# Patient Record
Sex: Male | Born: 1985 | Marital: Single
Health system: Southern US, Community
[De-identification: ages and names within clinical notes are randomized; demographics above are authoritative.]

---

## 2014-01-19 ENCOUNTER — Ambulatory Visit (HOSPITAL_COMMUNITY)
Admission: RE | Admit: 2014-01-19 | Discharge: 2014-01-19 | Disposition: A | Discharge status: Home / Self Care | Payer: Self-pay | Source: Ambulatory Visit | Attending: Internal Medicine | Admitting: Internal Medicine

## 2014-01-19 ENCOUNTER — Other Ambulatory Visit (HOSPITAL_COMMUNITY): Payer: Self-pay | Admitting: Internal Medicine

## 2014-01-19 DIAGNOSIS — N509 Disorder of male genital organs, unspecified: Secondary | ICD-10-CM | POA: Insufficient documentation

## 2014-01-19 DIAGNOSIS — N508 Other specified disorders of male genital organs: Secondary | ICD-10-CM | POA: Insufficient documentation

## 2014-09-25 IMAGING — US US SCROTUM
1 series · 14 of 25 positions shown · non-contrast
Comparison: None.

CLINICAL DATA: Left-sided testicular pain.

EXAM:
SCROTAL ULTRASOUND
DOPPLER ULTRASOUND OF THE TESTICLES
TECHNIQUE: Complete ultrasound examination of the testicles, epididymis, and
other scrotal structures was performed. Color and spectral Doppler
ultrasound were also utilized to evaluate blood flow to the
testicles.

[Series 1: us scrotum · 0.06mm/px · 14 of 60 slices shown]
[im 1/60]
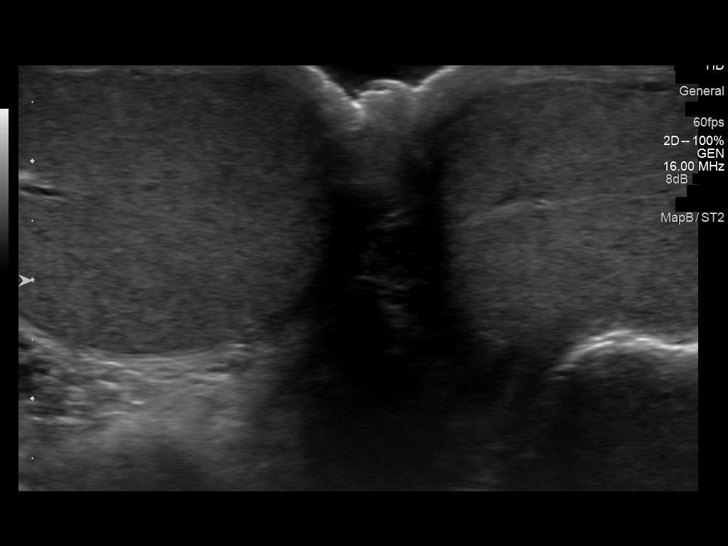
[im 5/60]
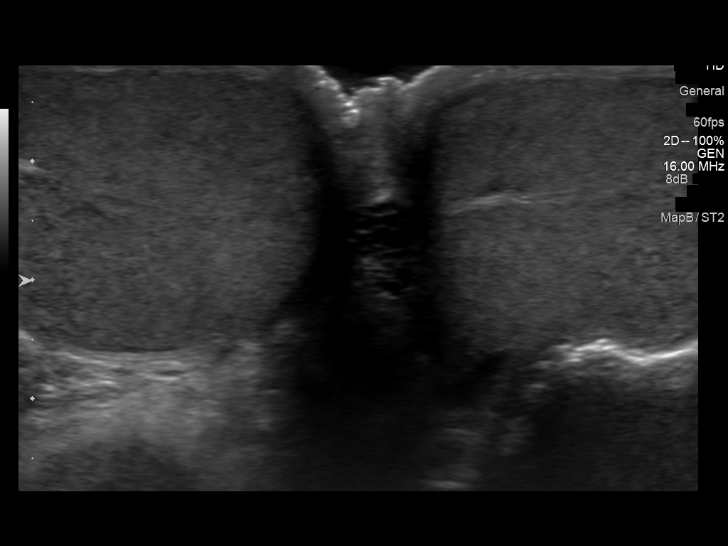
[im 10/60]
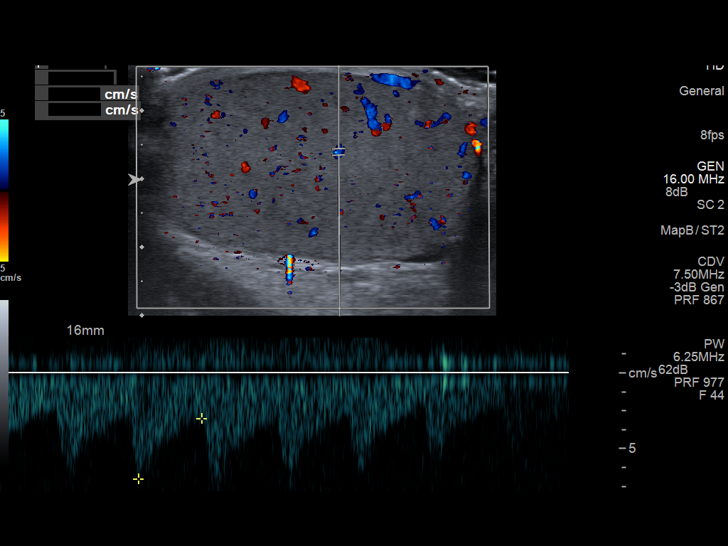
[im 15/60]
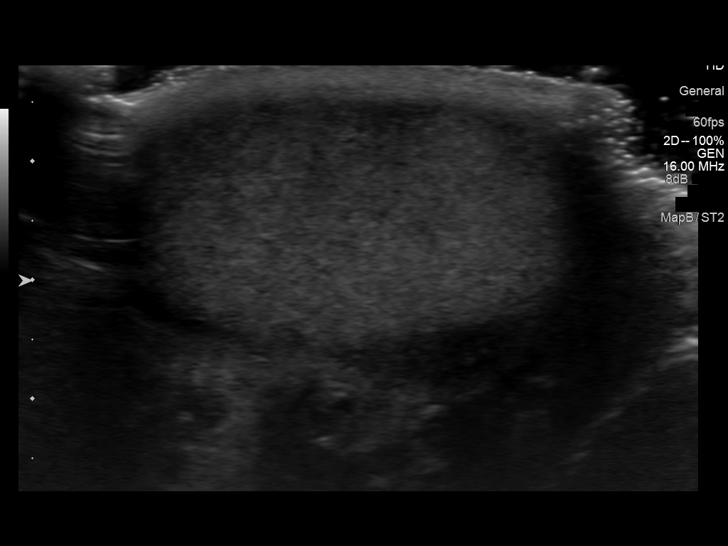
[im 20/60]
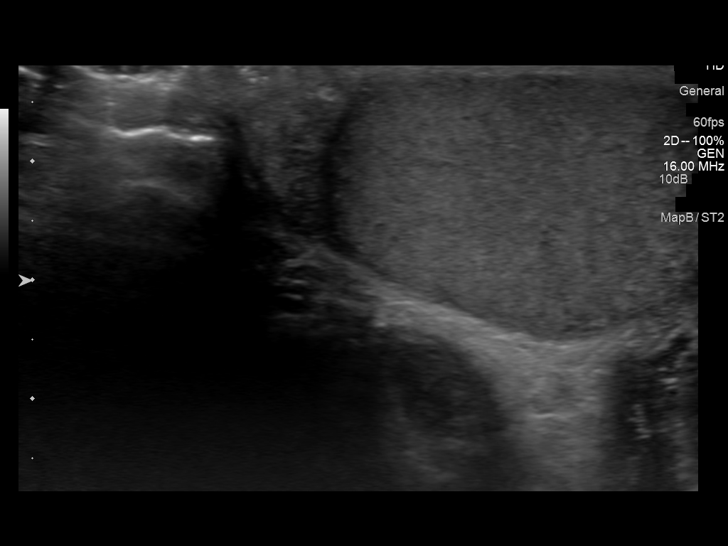
[im 23/60]
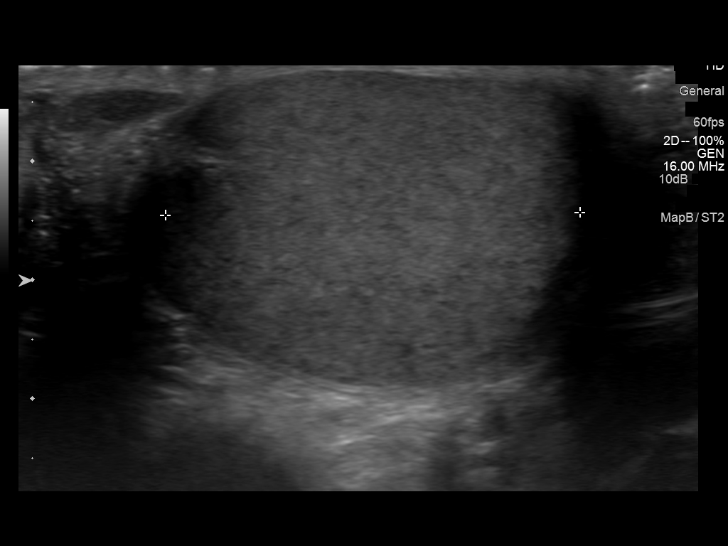
[im 28/60]
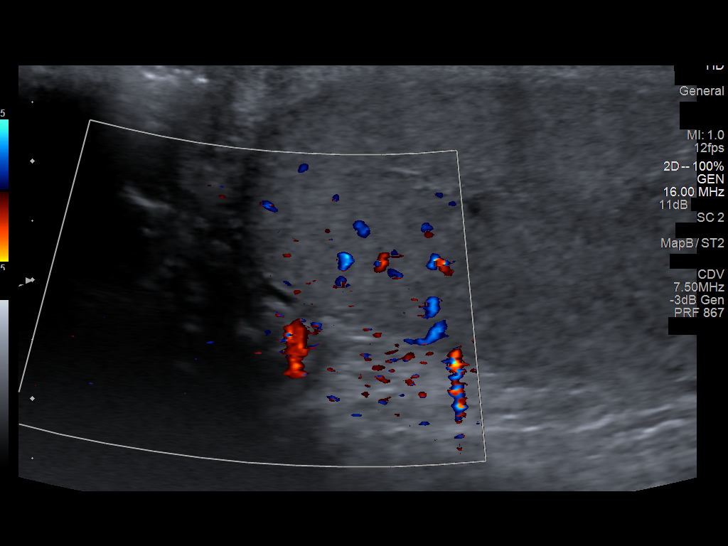
[im 32/60]
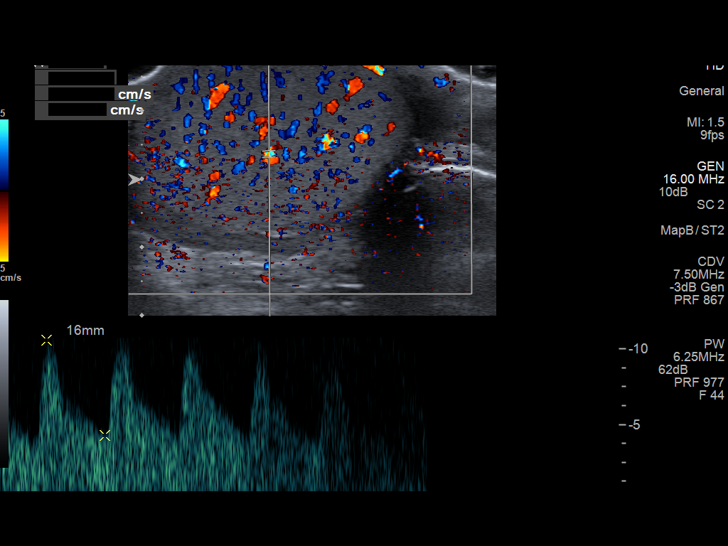
[im 37/60]
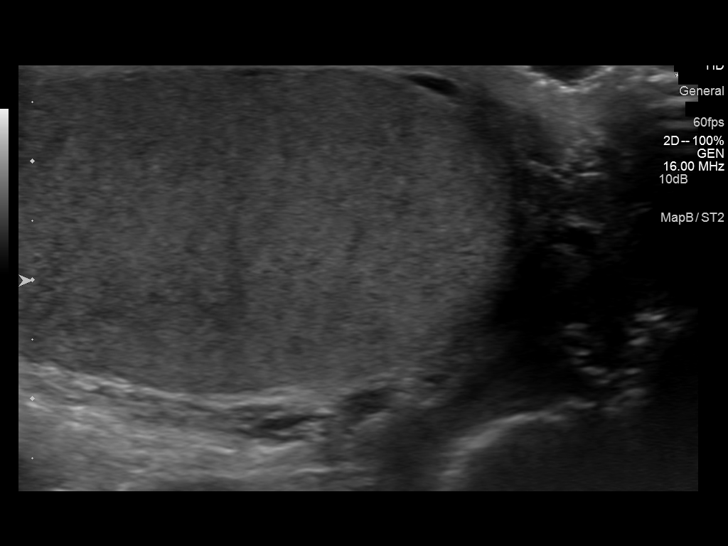
[im 40/60]
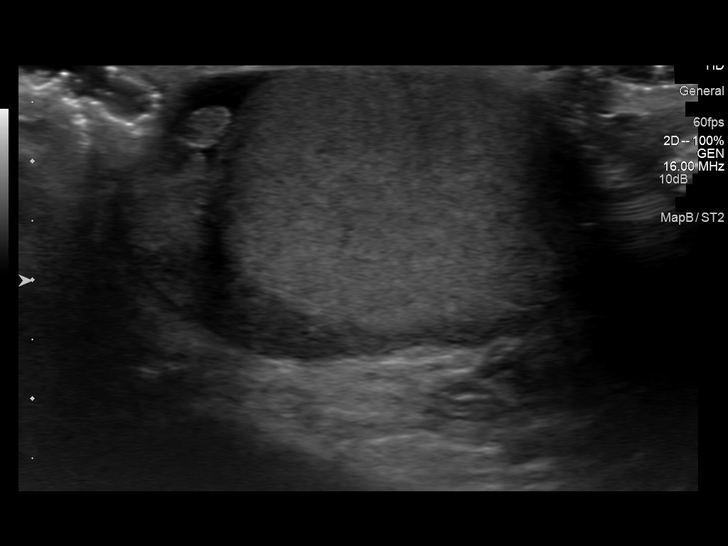
[im 45/60]
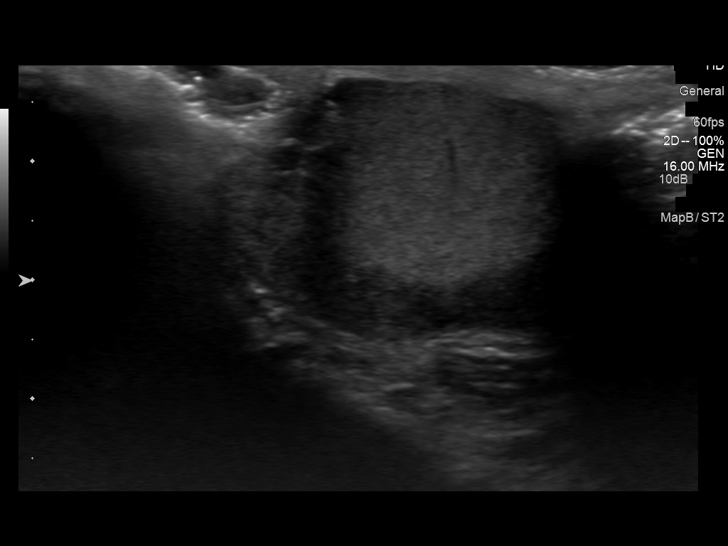
[im 50/60]
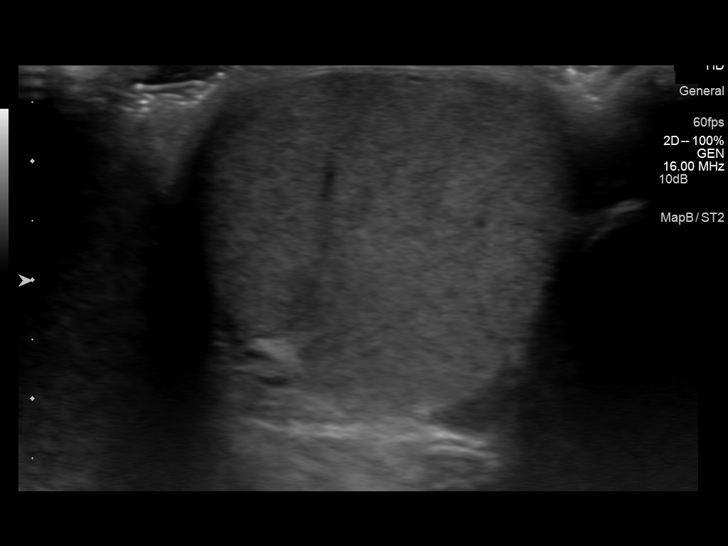
[im 55/60]
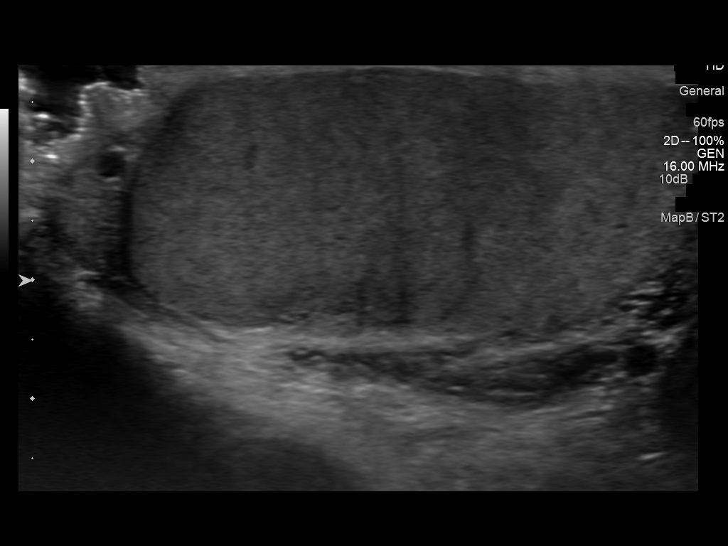
[im 60/60]
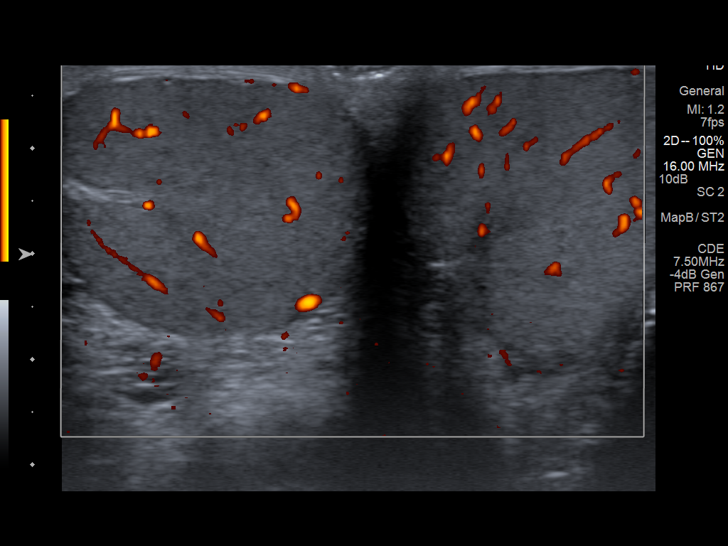

[14 of 25 positions shown; findings below may reference images not displayed]

FINDINGS: Right testicle

Measurements: 4.9 x 2.8 x 3.5 cm. No mass or microlithiasis
visualized.

Left testicle

Measurements: 5.3 x 2.6 x 3.0 cm. No mass or microlithiasis
visualized.

Right epididymis:  Normal in size and appearance.

Left epididymis: Benign small 0.3 cm left epididymal cyst
identified.

Hydrocele:  None visualized.

Varicocele:  None visualized.

Pulsed Doppler interrogation of both testes demonstrates low
resistance arterial and venous waveforms bilaterally. No abnormal
testicular or epididymal hyperemia.
IMPRESSION: Normal scrotal ultrasound and Doppler study demonstrating no
evidence of testicular mass, inflammatory process or torsion. A tiny
left epididymal cyst is benign.
